# Patient Record
Sex: Male | Born: 1990 | Race: White | Hispanic: No | Marital: Single | Smoking: Never smoker
Health system: Southern US, Community
[De-identification: ages and names within clinical notes are randomized; demographics above are authoritative.]

## PROBLEM LIST (undated history)

## (undated) DIAGNOSIS — J45990 Exercise induced bronchospasm: Secondary | ICD-10-CM

## (undated) HISTORY — PX: TOOTH EXTRACTION: SUR596

---
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
## 2008-02-01 ENCOUNTER — Emergency Department (HOSPITAL_COMMUNITY): Admission: EM | Admit: 2008-02-01 | Discharge: 2008-02-01 | Payer: Self-pay | Admitting: Family Medicine

## 2008-12-07 IMAGING — CR DG ANKLE COMPLETE 3+V*R*
3 series · 3 of 3 positions shown · non-contrast
Comparison: none

CLINICAL DATA: Right ankle injury playing basketball. Lateral swelling.
 RIGHT ANKLE - 3 VIEW:

[view not recorded (1 of 3)]
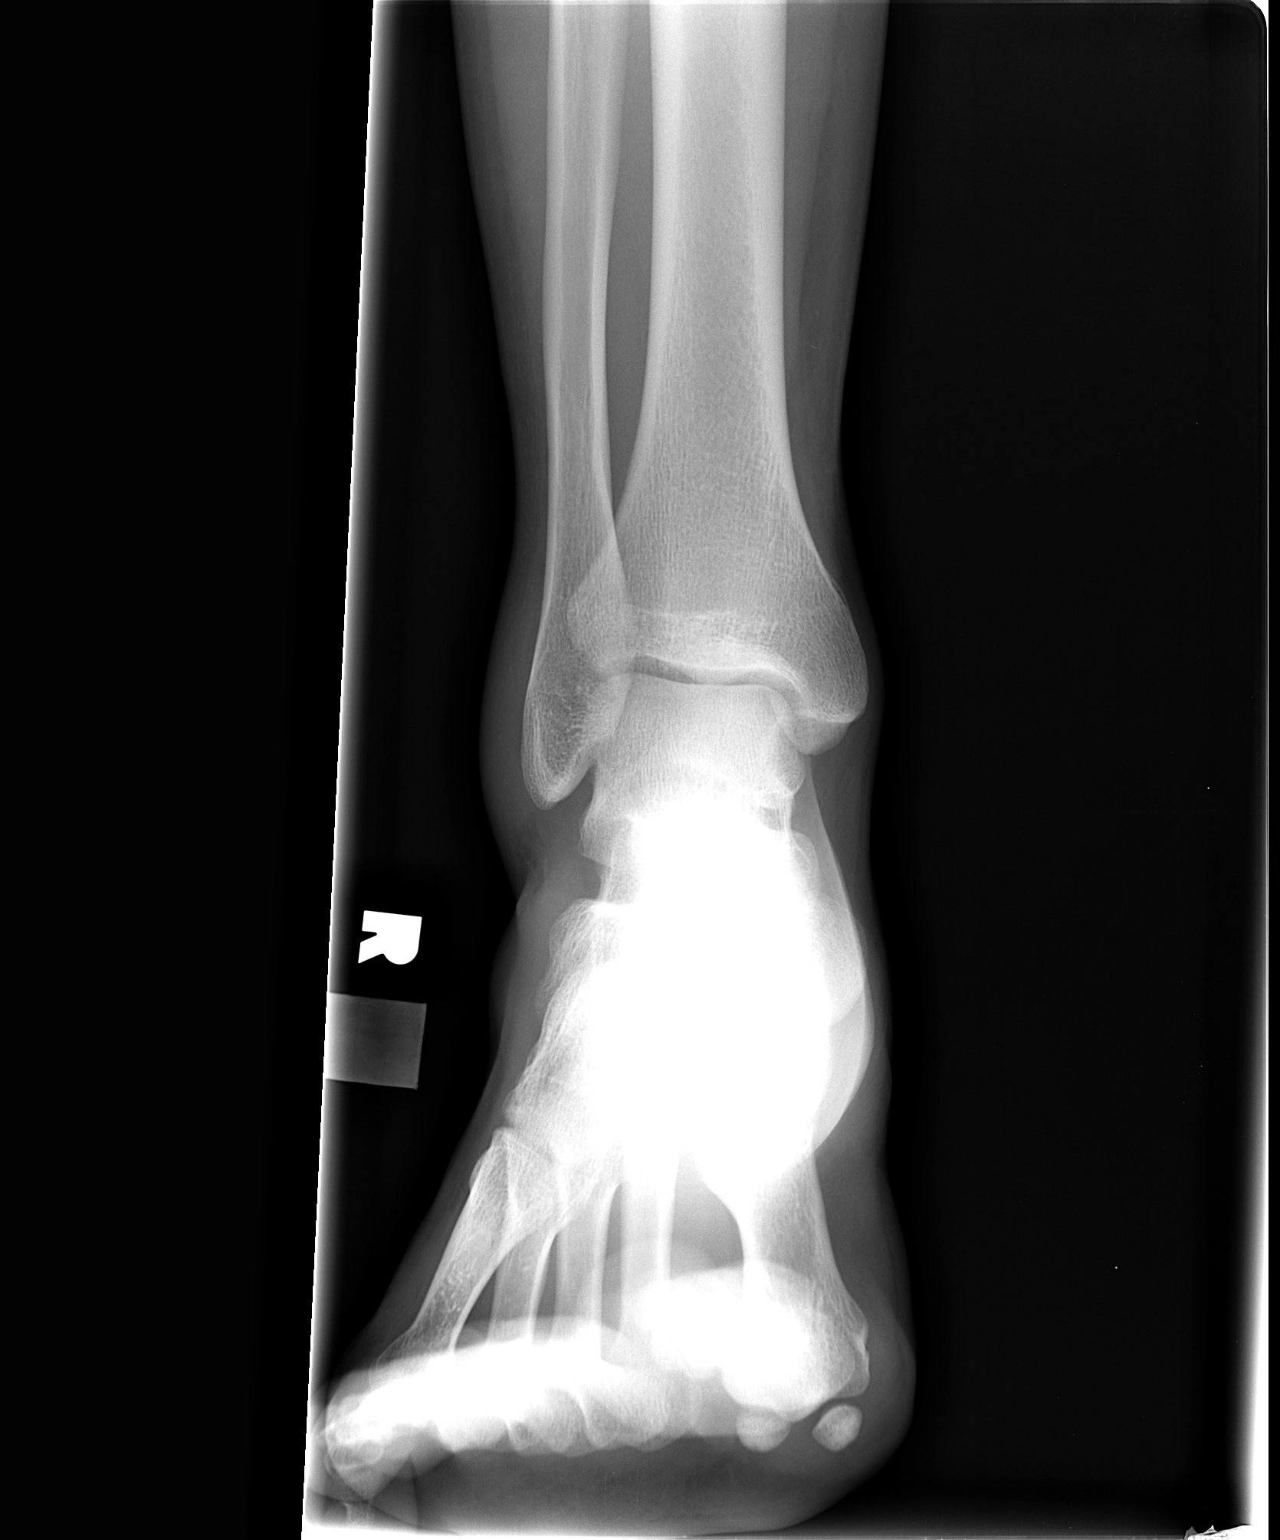

[view not recorded (2 of 3)]
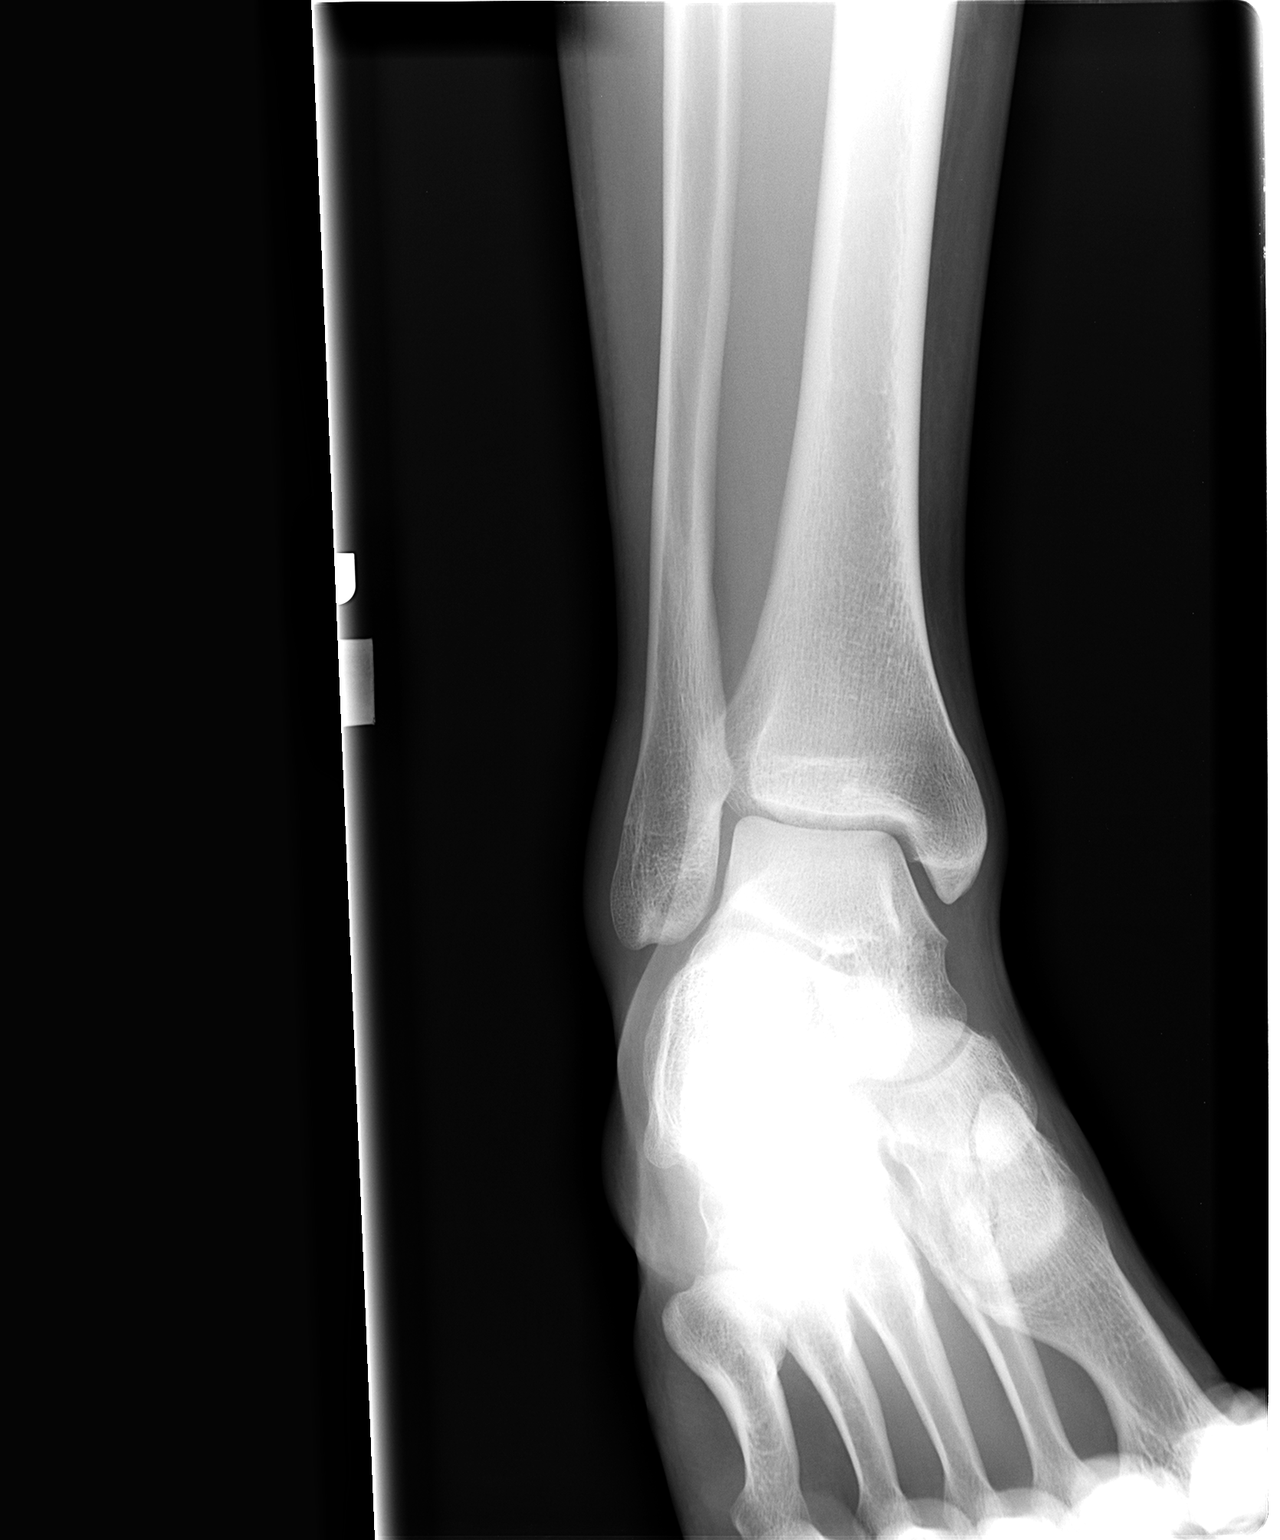

[view not recorded (3 of 3)]
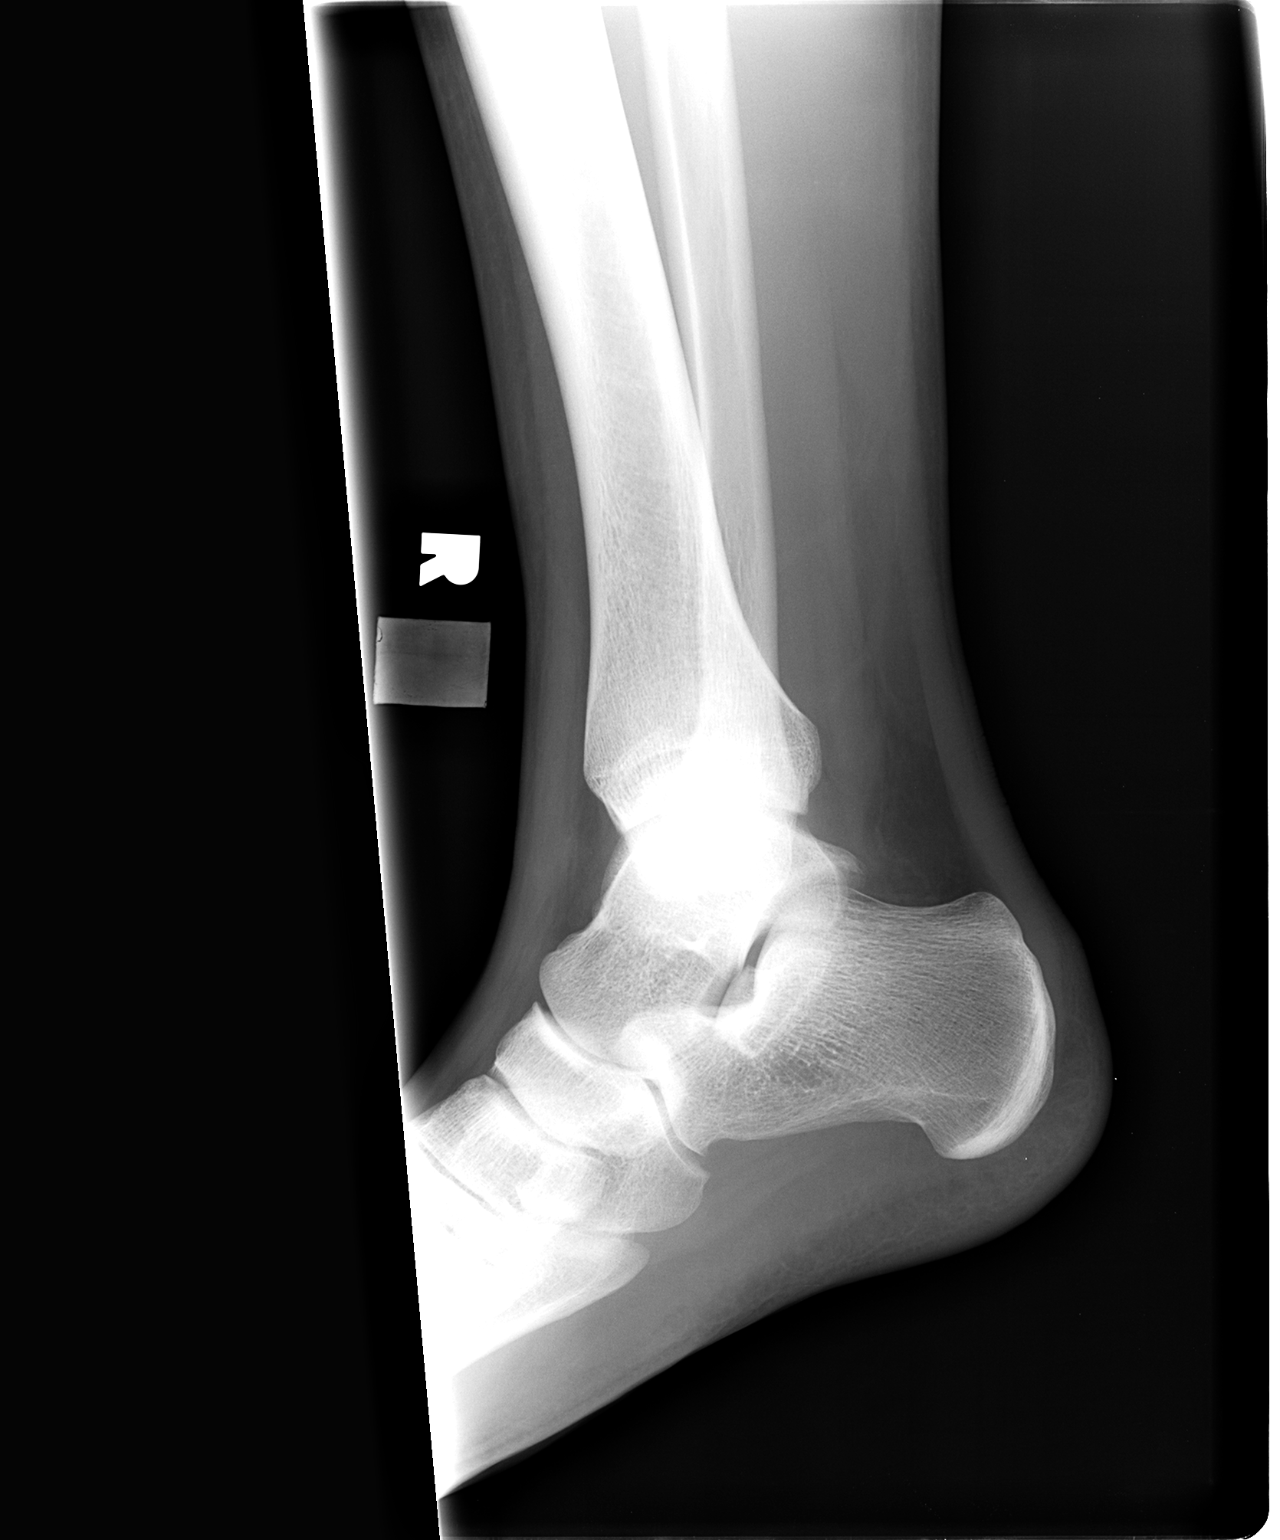

[3 of 3 positions shown; findings below may reference images not displayed]

FINDINGS: There is lateral soft tissue swelling, but no sign of fracture or dislocation.
IMPRESSION: As discussed above.

## 2010-03-05 ENCOUNTER — Emergency Department (HOSPITAL_COMMUNITY): Admission: EM | Admit: 2010-03-05 | Discharge: 2010-03-06 | Payer: Self-pay | Admitting: Emergency Medicine

## 2011-02-06 NOTE — Discharge Summary (Signed)
[name], [name]             ACCOUNT NO.:  [id number]   MEDICAL RECORD NO.:  [id number]          PATIENT TYPE:  INP   LOCATION:  1517                         FACILITY:  [facility]   PHYSICIAN:  Madlyn Frankel. Charlann Boxer, M.D.  DATE OF BIRTH:  [birth year]   DATE OF ADMISSION:  08/19/2006  DATE OF DISCHARGE:  08/22/2006                               DISCHARGE SUMMARY   ADMISSION DIAGNOSES:  1. Cellulitis.  2. Attention-deficit hyperactivity disorder.   DISCHARGE DIAGNOSES:  1. Cellulitis.  2. Attention-deficit hyperactivity disorder.  3. Acute lymphadenitis.   PROCEDURE:  Incision and drainage to left groin, upper thigh.   CONSULTS:  Infectious disease for antibiotic recommendations, left groin  abscess infection.  A wound consult took place on August 20, 2006.   PRE-ADMISSION LABS:  Pre admission CBC:  Hemoglobin 1.8, hematocrit 35.  Pre-admission white cell differential within normal limits.  Pre-  admission coagulation within normal limits.  Pre-admission routine  chemistries:  Sodium 142, potassium 3.9, glucose 95.  Gram's stain taken  from the left groin showed no organisms, moderate white blood cells  present.  Aerobic cultures taken showed no growth after two days.  Wound  and tissue cultures showed moderate white blood cells, both  polymorphonuclear and mononuclear, rare squamous epithelial cells  present.  No organisms seen and no growth after two days.  Anaerobic  cultures taken showed moderate white blood cells.  No organisms, no  anaerobes isolated.  Cultured for five days.  Fungus cultures, no yeast  or fungal elements seen.  Continued culture and progress for four weeks  for fungus culture.   BRIEF HISTORY:  This 20 year old male presented to our clinic, who had a  painful boil on his left groin region.  He stated that approximately 3-4  weeks ago, he was in the gym lifting weights and heard a pop.  After  that time, he noticed a small bump that enlarged over the course  of time  and presents today with an approximately 8 x 8 cm raised, red boil that  is painful to touch, that is red and tender to palpation.  No active  drainage at this time.  Unable to appreciate any lymphadenopathy with  physical exam due to the painful nature of the boil.   HOSPITAL COURSE:  Patient was admitted to the hospital for incision and  drainage.  He was taken to the operating room.  Procedure was performed.  He was in stable condition.  After PACU, was brought up to the  orthopedic floor.   On postop day #1, he was doing fine.  He was afebrile.  The left thigh  had some minor drainage.  It was packed, and we were awaiting some ID  consult to determine the antibiotics to utilize.  We had him on IV  vancomycin until we had IV consult.  Wound consult came on November 30.  Noted there was tunneling towards the groin.  Recommended packing daily  with iodoform.  Keep the wound clean.  Control the drainage to implement  the healing.  I instructed the patient on a method of doing this.  I  recommended warm baths approved by surgery.  Infectious disease  consulted.  They recommended to continue with the empiric vancomycin.   Ortho checked on postop day #2.  He continued to do well.  ID came by.  Saw him on August 21, 2006 and determined he probably had a MRSA  abscess.  Recommended changing the vancomycin to doxycycline 100 mg p.o.  b.i.d. x7 days.  They also discussed with the patient good hygiene.   On December 2, seen by orthopedics again.  Minimal drainage.  Continue  the doxycycline.  Was ready to be discharged home with final  instructions.   DISCHARGE DISPOSITION:  Discharged home in stable and improved  condition.   DISCHARGE WOUND CARE:  Includes wound packing with iodoform on a daily  basis.  Keep hands clean.  Keep use warm compresses as needed but keep  wound dry.   DISCHARGE FOLLOWUP:  Follow up with Dr. Charlann Boxer in the middle of the next  week, [phone].    DISCHARGE MEDICATIONS:  1. Vicodin 5/500 1-2 p.o. q.4-6h. p.r.n. pain as needed.  2. Doxycycline 100 mg p.o. b.i.d. x7 days.  A prescription was      provided.  3. Continue home medications.     ______________________________  Yetta Glassman [address], [place]      Madlyn Frankel. Charlann Boxer, M.D.  Electronically Signed    BLM/MEDQ  D:  09/07/2006  T:  09/07/2006  Job:  [id number]

## 2011-02-06 NOTE — Op Note (Signed)
[name], [name]                       ACCOUNT NO.:  [id number]   MEDICAL RECORD NO.:  [id number]                   PATIENT TYPE:  AMB   LOCATION:  DSC                                  FACILITY:  [facility]   PHYSICIAN:  Christopher E. Ezzard Standing, M.D.         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   PROCEDURE:  Left myringotomy and tube with a T-tube.   SURGEON:  Kristine Garbe. Ezzard Standing, M.D.   ANESTHESIA:  General endotracheal.   COMPLICATIONS:  None.   BRIEF CLINICAL NOTE:  The patient is a 20 year old who has failed a hearing  screen test in the left ear and on exam in the office has a large posterior  superior retraction pocket with what appears to be erosion of the long  process of the incus.  The right TM is normal with normal hearing evaluation  in the right ear.  He is taken to the operating room at this time for left  ear exam under anesthesia and placement of a left myringotomy tube to see if  this will help resolve the left retraction pocket.   DESCRIPTION OF PROCEDURE:  After adequate endotracheal anesthesia, the right  ear was examined first.  The right TM looked normal.  Next the left ear was  examined.  The patient had a large posterior superior retraction pocket with  what appears to be erosion of the long process of the incus.  The anterior  portion of the TM appeared relatively clear.  A myringotomy was made in the  anterior portion of the TM.  The middle ear space was dry.  A modified T-  tube was inserted without any difficulty.  Pediotic ear drops were placed.  This completed the procedure.  The patient was awoken from the anesthesia  and transferred to the recovery room postop doing well.    DISPOSITION:  The patient was discharged home later this morning.  He is  given Pediotic ear drops to use twice a next day for the next two days.  Have him follow up in my office in two weeks for recheck.  If this pocket  does not resolve, we will require further tympanoplasty and ossiculoplasty  down the road.                                               Kristine Garbe. Ezzard Standing, M.D.    CEN/MEDQ  D:  07/07/2002  T:  07/08/2002  Job:  [id number]   cc:   Marline Backbone.  Samuel Bouche, M.D.  [address]  [place]  [place]  [phone]  Fax: [phone]

## 2011-02-06 NOTE — Op Note (Signed)
[name], [name]             ACCOUNT NO.:  [id number]   MEDICAL RECORD NO.:  [id number]          PATIENT TYPE:  INP   LOCATION:  1517                         FACILITY:  [facility]   PHYSICIAN:  Madlyn Frankel. Charlann Boxer, M.D.  DATE OF BIRTH:  1991-03-01   DATE OF PROCEDURE:  08/19/2006  DATE OF DISCHARGE:                               OPERATIVE REPORT   PREOPERATIVE DIAGNOSIS:  Left proximal thigh abscess.   POSTOPERATIVE DIAGNOSIS:  Left proximal thigh abscess.   PROCEDURE:  Left proximal thigh I&D with tissue and fluid sent for  culture analysis.   SURGEON:  Charlann Boxer.   ASSISTANT:  Lenise Herald, P.A.C.   ANESTHESIA:  General LIMA.   SPECIMENS:  Tissue including what was felt to be lymph node, as well as  abscessed fluid sent off to Path and Micro for analysis.   BLOOD LOSS:  Minimal.   COMPLICATIONS:  None.   INDICATION FOR PROCEDURE:  Ricky is a 20 year old male who presented  to the office the day prior to the surgery for evaluation of left thigh  swelling.  This has been present for 4 weeks with some pain with  activity.  He felt that it was a muscle strain initially but it kept  getting bigger and worse with more erythema.  He reports no fevers,  chills, night sweats or other constitutional symptoms.  There is no  significant streaking cellulitic changes.  After reviewing with him  these findings, it was felt the best way to treat this was an open I&D,  particularly with the concerns for community-acquired MRSA at this  point.  Consent obtained.   PROCEDURE IN DETAIL:  Patient was brought to the operative theater.  Once adequate anesthesia was established, the patient's left proximal  thigh was prepped and draped circumferentially.  The groin was prepped  out with Ioban dressing.  Antibiotics were held until the incision was  made.  A 4 cm vertical incision was made lateral to the large mass, I  then opened this up and there was a large efflux of purulent material  and  cultures were obtained at this point.  I did open this up a little  bit more to allow for debridement and digital palpation, which did not  appear to penetrate the muscle fascia.   I did palpate a lymph node which was removed.  I then also sent a  section for Pathology but then also to Micro.   The wound was then irrigated with 3L of normal saline solution with  pulse lavage.   I then reapproximated the proximal and distal aspect of the wound and  left the central portion open, with plans to pack this and allow it to  close from secondary intent.   Following the packing with Iodoform gauze, the wound was dressed with  gauze, ABD and tape.  The patient was then awakened from anesthesia and  transferred to the recovery room in stable condition.      Madlyn Frankel Charlann Boxer, M.D.  Electronically Signed     MDO/MEDQ  D:  08/20/2006  T:  08/20/2006  Job:  [id number]

## 2011-06-15 LAB — RAPID STREP SCREEN (MED CTR MEBANE ONLY): Streptococcus, Group A Screen (Direct): NEGATIVE

## 2014-01-19 ENCOUNTER — Emergency Department (HOSPITAL_BASED_OUTPATIENT_CLINIC_OR_DEPARTMENT_OTHER)
Admission: EM | Admit: 2014-01-19 | Discharge: 2014-01-19 | Disposition: A | Discharge status: Home / Self Care | Payer: Worker's Compensation | Attending: Emergency Medicine | Admitting: Emergency Medicine

## 2014-01-19 ENCOUNTER — Encounter (HOSPITAL_BASED_OUTPATIENT_CLINIC_OR_DEPARTMENT_OTHER): Payer: Self-pay | Admitting: Emergency Medicine

## 2014-01-19 DIAGNOSIS — S99929A Unspecified injury of unspecified foot, initial encounter: Secondary | ICD-10-CM

## 2014-01-19 DIAGNOSIS — S8990XA Unspecified injury of unspecified lower leg, initial encounter: Secondary | ICD-10-CM | POA: Insufficient documentation

## 2014-01-19 DIAGNOSIS — IMO0002 Reserved for concepts with insufficient information to code with codable children: Secondary | ICD-10-CM | POA: Insufficient documentation

## 2014-01-19 DIAGNOSIS — Y9389 Activity, other specified: Secondary | ICD-10-CM | POA: Insufficient documentation

## 2014-01-19 DIAGNOSIS — S39012A Strain of muscle, fascia and tendon of lower back, initial encounter: Secondary | ICD-10-CM

## 2014-01-19 DIAGNOSIS — Y92410 Unspecified street and highway as the place of occurrence of the external cause: Secondary | ICD-10-CM | POA: Insufficient documentation

## 2014-01-19 DIAGNOSIS — S99919A Unspecified injury of unspecified ankle, initial encounter: Secondary | ICD-10-CM

## 2014-01-19 MED ORDER — CYCLOBENZAPRINE HCL 10 MG PO TABS
5.0000 mg | ORAL_TABLET | Freq: Once | ORAL | Status: AC
  Administered 2014-01-19: 5 mg via ORAL
  Filled 2014-01-19: qty 1

## 2014-01-19 MED ORDER — OXYCODONE-ACETAMINOPHEN 5-325 MG PO TABS
2.0000 | ORAL_TABLET | Freq: Once | ORAL | Status: AC
Start: 2014-01-19 — End: 2014-01-19
  Administered 2014-01-19: 2 via ORAL
  Filled 2014-01-19: qty 2

## 2014-01-19 MED ORDER — CYCLOBENZAPRINE HCL 10 MG PO TABS: 10.0000 mg | ORAL_TABLET | Freq: Two times a day (BID) | ORAL | Status: DC | PRN

## 2014-01-19 MED ORDER — IBUPROFEN 800 MG PO TABS: 800.0000 mg | ORAL_TABLET | Freq: Three times a day (TID) | ORAL | Status: DC

## 2014-01-19 MED ORDER — OXYCODONE-ACETAMINOPHEN 5-325 MG PO TABS: 1.0000 | ORAL_TABLET | ORAL | Status: DC | PRN

## 2014-01-19 MED ORDER — IBUPROFEN 800 MG PO TABS
800.0000 mg | ORAL_TABLET | Freq: Once | ORAL | Status: AC
  Administered 2014-01-19: 800 mg via ORAL
  Filled 2014-01-19: qty 1

## 2014-01-19 NOTE — ED Notes (Signed)
EMT continues to work on drug screen. Will give meds when he is finished. Urine has not been collected.

## 2014-01-19 NOTE — ED Notes (Signed)
UDS finally obtained
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
## 2014-01-19 NOTE — ED Notes (Addendum)
Unable to give us a UBS at the moment, BAT was obtained

## 2014-01-19 NOTE — Discharge Instructions (Signed)
Take motrin 800 mg every 6 hrs for 2 days then as needed.   Apply ice to area.   Take percocet as needed for severe pain. Do NOT drive with it.   Take flexeril for muscle spasms.   Follow up with your doctor.   Rest for 2 days.   Return to ER if you have severe pain, unable to walk, vomiting.

## 2014-01-19 NOTE — ED Notes (Signed)
Pt not able to void.  Is drinking water at this time.

## 2014-04-12 ENCOUNTER — Encounter (HOSPITAL_COMMUNITY): Payer: Self-pay | Admitting: Emergency Medicine

## 2014-04-12 ENCOUNTER — Emergency Department (HOSPITAL_COMMUNITY)
Admission: EM | Admit: 2014-04-12 | Discharge: 2014-04-12 | Disposition: A | Discharge status: Home / Self Care | Payer: Self-pay | Attending: Emergency Medicine | Admitting: Emergency Medicine

## 2014-04-12 DIAGNOSIS — B029 Zoster without complications: Secondary | ICD-10-CM | POA: Insufficient documentation

## 2014-04-12 DIAGNOSIS — Z8709 Personal history of other diseases of the respiratory system: Secondary | ICD-10-CM | POA: Insufficient documentation

## 2014-04-12 DIAGNOSIS — R21 Rash and other nonspecific skin eruption: Secondary | ICD-10-CM | POA: Insufficient documentation

## 2014-04-12 HISTORY — DX: Exercise induced bronchospasm: J45.990

## 2014-04-12 MED ORDER — TETRACAINE HCL 0.5 % OP SOLN
2.0000 [drp] | Freq: Once | OPHTHALMIC | Status: AC
  Administered 2014-04-12: 2 [drp] via OPHTHALMIC
  Filled 2014-04-12: qty 2

## 2014-04-12 MED ORDER — FLUORESCEIN SODIUM 1 MG OP STRP
1.0000 | ORAL_STRIP | Freq: Once | OPHTHALMIC | Status: AC
  Administered 2014-04-12: 1 via OPHTHALMIC
  Filled 2014-04-12: qty 1

## 2014-04-12 MED ORDER — HYDROCODONE-ACETAMINOPHEN 5-325 MG PO TABS
ORAL_TABLET | ORAL | Status: AC
Start: 2014-04-12 — End: ?

## 2014-04-12 MED ORDER — ACYCLOVIR 800 MG PO TABS
800.0000 mg | ORAL_TABLET | Freq: Once | ORAL | Status: AC
  Administered 2014-04-12: 800 mg via ORAL
  Filled 2014-04-12: qty 1

## 2014-04-12 MED ORDER — ACYCLOVIR 800 MG PO TABS: 400.0000 mg | ORAL_TABLET | Freq: Every day | ORAL | Status: AC

## 2014-04-12 NOTE — Discharge Instructions (Signed)

## 2014-04-12 NOTE — ED Notes (Addendum)
Pt reports rash around left eye and left side of head. Pt reports light and sound sensitivity.No active drainage noted to site. Pt denies any fever,n/v.

## 2014-04-12 NOTE — ED Provider Notes (Signed)
CSN: [id number]     Arrival date & time 04/12/14  1733 History   First MD Initiated Contact with Patient 04/12/14 1803     Chief Complaint  Patient presents with  . Rash     (Consider location/radiation/quality/duration/timing/severity/associated sxs/prior Treatment)  [name] is a 23 y.o. male who presents to the Emergency Department complaining of painful rash to his left upper face. Patient is a 23 y.o. male presenting with rash. The history is provided by the patient.  Rash Location:  Face Facial rash location:  Forehead and L eyebrow (scalp) Quality: blistering, painful and redness   Quality: not draining and not itchy   Pain details:    Quality:  Tingling, aching and burning   Severity:  Moderate   Onset quality:  Gradual   Duration:  3 days   Timing:  Constant   Progression:  Unchanged Severity:  Moderate Onset quality:  Gradual Timing:  Constant Progression:  Unchanged Chronicity:  New Context: not exposure to similar rash, not insect bite/sting, not medications, not new detergent/soap, not plant contact and not sick contacts   Relieved by:  Nothing Worsened by:  Nothing tried Ineffective treatments:  None tried Associated symptoms: no fever, no headaches, no periorbital edema, no shortness of breath, no sore throat, no throat swelling, no tongue swelling, no URI, not vomiting and not wheezing   Associated symptoms comment:  Left eye pain   Past Medical History  Diagnosis Date  . Exercise induced bronchospasm    Past Surgical History  Procedure Laterality Date  . Tooth extraction      x3   History reviewed. No pertinent family history. History  Substance Use Topics  . Smoking status: Never Smoker   . Smokeless tobacco: Current User    Types: Snuff  . Alcohol Use: No    Review of Systems  Constitutional: Negative for fever, chills, activity change and appetite change.  HENT: Negative for congestion, ear pain, facial swelling, sore throat and  trouble swallowing.   Eyes: Positive for pain. Negative for photophobia, discharge, redness, itching and visual disturbance.  Respiratory: Negative for chest tightness, shortness of breath and wheezing.   Gastrointestinal: Negative for vomiting.  Musculoskeletal: Negative for neck pain and neck stiffness.  Skin: Positive for rash. Negative for wound.  Neurological: Negative for dizziness, weakness, numbness and headaches.  Hematological: Negative for adenopathy.  All other systems reviewed and are negative.     Allergies  Review of patient's allergies indicates no known allergies.  Home Medications   Prior to Admission medications   Medication Sig Start Date End Date Taking? Authorizing Provider  acyclovir (ZOVIRAX) 800 MG tablet Take 0.5 tablets (400 mg total) by mouth 5 (five) times daily. For 10 days 04/12/14   Caroly Purewal L. Michele Kerlin, PA-C  HYDROcodone-acetaminophen (NORCO/VICODIN) 5-325 MG per tablet Take one-two tabs po q 4-6 hrs prn pain 04/12/14   Jesyca Weisenburger L. Luan Urbani, PA-C   BP 121/67  Pulse 97  Temp(Src) 98.8 F (37.1 C) (Oral)  Resp 16  Ht 5\' 11"  (1.803 m)  Wt 156 lb (70.761 kg)  BMI 21.77 kg/m2  SpO2 100% Physical Exam  Nursing note and vitals reviewed. Constitutional: He is oriented to person, place, and time. He appears well-developed and well-nourished. No distress.  HENT:  Head: Normocephalic and atraumatic.    Mouth/Throat: Oropharynx is clear and moist.  Erythematous slightly raised lesions with grouped scabbed lesions present.    Eyes: EOM are normal. Pupils are equal, round, and reactive to  light. Lids are everted and swept, no foreign bodies found. Right eye exhibits no chemosis. Left eye exhibits no chemosis, no discharge, no exudate and no hordeolum. No foreign body present in the left eye. Left conjunctiva is not injected. Left conjunctiva has no hemorrhage. Left eye exhibits normal extraocular motion and no nystagmus.  Fundoscopic exam:      The left eye shows  no exudate, no hemorrhage and no papilledema.  Slit lamp exam:      The left eye shows no corneal abrasion, no corneal flare, no corneal ulcer, no foreign body, no hyphema and no fluorescein uptake.  No dendritric lesions of left eye seen on slit lamp exam  Neck: Normal range of motion. Neck supple.  Cardiovascular: Normal rate, regular rhythm, normal heart sounds and intact distal pulses.   No murmur heard. Pulmonary/Chest: Effort normal and breath sounds normal. No respiratory distress.  Musculoskeletal: Normal range of motion. He exhibits no edema and no tenderness.  Lymphadenopathy:    He has no cervical adenopathy.  Neurological: He is alert and oriented to person, place, and time. He exhibits normal muscle tone. Coordination normal.  Skin: Skin is warm.    ED Course  Procedures (including critical care time) Labs Review Labs Reviewed - No data to display  Imaging Review No results found.   EKG Interpretation None      MDM   Final diagnoses:  Herpes zoster   Patient is well appearing, non-toxic with 3 days h/o lesions to left upper face that appear c/w zoster.   Patient also seen by Dr. Juleen China and care plan discussed.    Visual Acuity - Bilateral Distance: 20/13 ; R Distance: 20/20 ; L Distance: 20/20   Pt agrees to acyclovir and vicodin for pain.  Advised that he will need close PMD f/u in one week or to return here if eye involvement occurs.  Pt agrees to plan and appears stable for d/c  Javonne Dorko L. Trisha Mangle, PA-C 04/14/14 2125

## 2014-04-18 NOTE — ED Provider Notes (Signed)
Medical screening examination/treatment/procedure(s) were conducted as a shared visit with non-physician practitioner(s) and myself.  I personally evaluated the patient during the encounter.   EKG Interpretation None     23 year old male with a painful facial rash. This may potentially be shingles. He does seem to be isolated to the left V1 distribution. Will treat as such at this time. Does not appear to be cellulitic. Return precautions were discussed. Precautions with regards to his pregnant significant other were discussed as well.  Raeford RazorStephen Haeven Nickle, MD 04/18/14 [phone]

## 2017-01-25 ENCOUNTER — Encounter: Payer: Self-pay | Admitting: General Practice
